# Patient Record
Sex: Male | Born: 2000 | Race: Black or African American | Hispanic: No | Marital: Single | Smoking: Never smoker
Health system: Southern US, Community
[De-identification: ages and names within clinical notes are randomized; demographics above are authoritative.]

---

## 2020-12-10 ENCOUNTER — Encounter (HOSPITAL_COMMUNITY): Payer: Self-pay | Admitting: Emergency Medicine

## 2020-12-10 ENCOUNTER — Emergency Department (HOSPITAL_COMMUNITY): Payer: Federal, State, Local not specified - PPO

## 2020-12-10 ENCOUNTER — Emergency Department (HOSPITAL_COMMUNITY)
Admission: EM | Admit: 2020-12-10 | Discharge: 2020-12-10 | Disposition: A | Discharge status: Home / Self Care | Payer: Federal, State, Local not specified - PPO | Attending: Emergency Medicine | Admitting: Emergency Medicine

## 2020-12-10 ENCOUNTER — Other Ambulatory Visit: Payer: Self-pay

## 2020-12-10 DIAGNOSIS — M79641 Pain in right hand: Secondary | ICD-10-CM | POA: Insufficient documentation

## 2020-12-10 DIAGNOSIS — M25522 Pain in left elbow: Secondary | ICD-10-CM | POA: Diagnosis not present

## 2020-12-10 DIAGNOSIS — S60222A Contusion of left hand, initial encounter: Secondary | ICD-10-CM | POA: Diagnosis not present

## 2020-12-10 DIAGNOSIS — M545 Low back pain, unspecified: Secondary | ICD-10-CM | POA: Diagnosis present

## 2020-12-10 DIAGNOSIS — S39012A Strain of muscle, fascia and tendon of lower back, initial encounter: Secondary | ICD-10-CM | POA: Insufficient documentation

## 2020-12-10 MED ORDER — METHOCARBAMOL 500 MG PO TABS: 500.0000 mg | ORAL_TABLET | Freq: Two times a day (BID) | ORAL | 0 refills | Status: AC

## 2020-12-10 MED ORDER — IBUPROFEN 600 MG PO TABS: 600.0000 mg | ORAL_TABLET | Freq: Four times a day (QID) | ORAL | 0 refills | Status: AC | PRN
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
